# Patient Record
Sex: Male | Born: 1960 | Race: White | Hispanic: No | Marital: Married | Smoking: Former smoker
Health system: Southern US, Community
[De-identification: ages and names within clinical notes are randomized; demographics above are authoritative.]

## PROBLEM LIST (undated history)

## (undated) DIAGNOSIS — M549 Dorsalgia, unspecified: Secondary | ICD-10-CM

## (undated) HISTORY — PX: TONSILLECTOMY: SUR1361

## (undated) HISTORY — PX: BACK SURGERY: SHX140

## (undated) HISTORY — PX: FINGER SURGERY: SHX640

---

## 2007-09-14 ENCOUNTER — Ambulatory Visit: Payer: Self-pay | Admitting: Pain Medicine

## 2007-09-15 ENCOUNTER — Ambulatory Visit: Payer: Self-pay | Admitting: Pain Medicine

## 2007-10-01 ENCOUNTER — Ambulatory Visit: Payer: Self-pay | Admitting: Pain Medicine

## 2007-10-15 ENCOUNTER — Ambulatory Visit: Payer: Self-pay | Admitting: Pain Medicine

## 2007-11-02 ENCOUNTER — Ambulatory Visit: Payer: Self-pay | Admitting: Pain Medicine

## 2007-11-06 ENCOUNTER — Ambulatory Visit: Payer: Self-pay | Admitting: Pain Medicine

## 2007-11-18 ENCOUNTER — Ambulatory Visit: Payer: Self-pay | Admitting: Pain Medicine

## 2008-04-18 ENCOUNTER — Ambulatory Visit: Payer: Self-pay | Admitting: Unknown Physician Specialty

## 2008-04-21 ENCOUNTER — Inpatient Hospital Stay: Payer: Self-pay | Admitting: Unknown Physician Specialty

## 2008-05-12 ENCOUNTER — Ambulatory Visit: Payer: Self-pay | Admitting: Orthopedic Surgery

## 2008-09-02 ENCOUNTER — Ambulatory Visit: Payer: Self-pay | Admitting: Anesthesiology

## 2008-09-20 ENCOUNTER — Emergency Department: Payer: Self-pay | Admitting: Emergency Medicine

## 2008-10-03 ENCOUNTER — Ambulatory Visit: Payer: Self-pay | Admitting: Unknown Physician Specialty

## 2009-02-22 ENCOUNTER — Ambulatory Visit: Payer: Self-pay | Admitting: Unknown Physician Specialty

## 2009-06-01 ENCOUNTER — Ambulatory Visit: Payer: Self-pay

## 2009-07-13 ENCOUNTER — Ambulatory Visit: Payer: Self-pay | Admitting: Unknown Physician Specialty

## 2009-07-20 ENCOUNTER — Inpatient Hospital Stay: Payer: Self-pay | Admitting: Unknown Physician Specialty

## 2011-03-01 ENCOUNTER — Ambulatory Visit: Payer: Self-pay | Admitting: Unknown Physician Specialty

## 2011-03-21 ENCOUNTER — Ambulatory Visit: Payer: Self-pay | Admitting: Unknown Physician Specialty

## 2011-03-28 ENCOUNTER — Inpatient Hospital Stay: Payer: Self-pay | Admitting: Unknown Physician Specialty

## 2011-09-18 LAB — HEPATIC FUNCTION PANEL A (ARMC)
Albumin: 3.8 g/dL (ref 3.4–5.0)
Alkaline Phosphatase: 111 U/L (ref 50–136)
Bilirubin,Total: 0.6 mg/dL (ref 0.2–1.0)
SGOT(AST): 107 U/L — ABNORMAL HIGH (ref 15–37)
SGPT (ALT): 84 U/L — ABNORMAL HIGH
Total Protein: 7.8 g/dL (ref 6.4–8.2)

## 2011-09-18 LAB — CBC WITH DIFFERENTIAL/PLATELET
Basophil %: 1 %
Eosinophil #: 0.2 10*3/uL (ref 0.0–0.7)
HGB: 13.4 g/dL (ref 13.0–18.0)
Lymphocyte %: 33.7 %
MCHC: 32.4 g/dL (ref 32.0–36.0)
MCV: 102 fL — ABNORMAL HIGH (ref 80–100)
Monocyte #: 0.4 x10 3/mm (ref 0.2–1.0)
Monocyte %: 7.9 %
Neutrophil #: 2.5 10*3/uL (ref 1.4–6.5)
RDW: 14 % (ref 11.5–14.5)

## 2011-09-18 LAB — IRON AND TIBC
Iron Bind.Cap.(Total): 364 ug/dL (ref 250–450)
Iron Saturation: 43 %

## 2014-11-24 ENCOUNTER — Other Ambulatory Visit: Payer: Self-pay | Admitting: Anesthesiology

## 2014-11-24 DIAGNOSIS — M5417 Radiculopathy, lumbosacral region: Secondary | ICD-10-CM

## 2014-12-01 ENCOUNTER — Ambulatory Visit
Admission: RE | Admit: 2014-12-01 | Discharge: 2014-12-01 | Disposition: A | Discharge status: Home / Self Care | Payer: Managed Care, Other (non HMO) | Source: Ambulatory Visit | Attending: Anesthesiology | Admitting: Anesthesiology

## 2014-12-01 DIAGNOSIS — M5417 Radiculopathy, lumbosacral region: Secondary | ICD-10-CM

## 2014-12-01 MED ORDER — HYDROMORPHONE HCL 2 MG/ML IJ SOLN
1.5000 mg | Freq: Once | INTRAMUSCULAR | Status: AC
  Administered 2014-12-01: 1.5 mg via INTRAMUSCULAR

## 2014-12-01 MED ORDER — ONDANSETRON HCL 4 MG/2ML IJ SOLN: 4.0000 mg | Freq: Four times a day (QID) | INTRAMUSCULAR | Status: DC | PRN

## 2014-12-01 MED ORDER — DIAZEPAM 5 MG PO TABS
10.0000 mg | ORAL_TABLET | Freq: Once | ORAL | Status: AC
  Administered 2014-12-01: 10 mg via ORAL

## 2014-12-01 MED ORDER — IOHEXOL 180 MG/ML  SOLN
15.0000 mL | Freq: Once | INTRAMUSCULAR | Status: AC | PRN
  Administered 2014-12-01: 15 mL via INTRATHECAL

## 2014-12-01 MED ORDER — ONDANSETRON HCL 4 MG/2ML IJ SOLN
4.0000 mg | Freq: Once | INTRAMUSCULAR | Status: AC
  Administered 2014-12-01: 4 mg via INTRAMUSCULAR

## 2014-12-01 NOTE — Discharge Instructions (Signed)

## 2015-01-13 ENCOUNTER — Other Ambulatory Visit: Payer: Self-pay | Admitting: Neurological Surgery

## 2015-02-01 ENCOUNTER — Encounter (HOSPITAL_COMMUNITY)
Admission: RE | Admit: 2015-02-01 | Discharge: 2015-02-01 | Disposition: A | Discharge status: Home / Self Care | Payer: Managed Care, Other (non HMO) | Source: Ambulatory Visit | Attending: Neurological Surgery | Admitting: Neurological Surgery

## 2015-02-01 ENCOUNTER — Encounter (HOSPITAL_COMMUNITY): Payer: Self-pay

## 2015-02-01 DIAGNOSIS — Z0183 Encounter for blood typing: Secondary | ICD-10-CM | POA: Diagnosis not present

## 2015-02-01 DIAGNOSIS — M4316 Spondylolisthesis, lumbar region: Secondary | ICD-10-CM | POA: Insufficient documentation

## 2015-02-01 DIAGNOSIS — Z01812 Encounter for preprocedural laboratory examination: Secondary | ICD-10-CM | POA: Diagnosis present

## 2015-02-01 HISTORY — DX: Dorsalgia, unspecified: M54.9

## 2015-02-01 LAB — ABO/RH: ABO/RH(D): O POS

## 2015-02-01 LAB — TYPE AND SCREEN
ABO/RH(D): O POS
ANTIBODY SCREEN: NEGATIVE

## 2015-02-01 LAB — CBC
HCT: 43 % (ref 39.0–52.0)
Hemoglobin: 14.5 g/dL (ref 13.0–17.0)
MCH: 34.4 pg — ABNORMAL HIGH (ref 26.0–34.0)
MCHC: 33.7 g/dL (ref 30.0–36.0)
MCV: 102.1 fL — ABNORMAL HIGH (ref 78.0–100.0)
PLATELETS: 176 10*3/uL (ref 150–400)
RBC: 4.21 MIL/uL — AB (ref 4.22–5.81)
RDW: 11.7 % (ref 11.5–15.5)
WBC: 6.1 10*3/uL (ref 4.0–10.5)

## 2015-02-01 LAB — SURGICAL PCR SCREEN
MRSA, PCR: NEGATIVE
Staphylococcus aureus: NEGATIVE

## 2015-02-01 NOTE — Pre-Procedure Instructions (Signed)
    James Erickson  02/01/2015     No Pharmacies Listed   Your procedure is scheduled on 02-07-2015  Tuesday   Report to [facility] Admitting at 5:30  A.M.   Call this number if you have problems the morning of surgery:  [phone]   Remember:  Do not eat food or drink liquids after midnight.   Take these medicines the morning of surgery with A SIP OF WATER pain medication if needed ,lyrcia   Do not wear jewelry,  Do not wear lotions, powders, or perfumes.  You may not wear deodorant.  Do not shave 48 hours prior to surgery.  Men may shave face and neck.   Do not bring valuables to the hospital.  [facility] is not responsible for any belongings or valuables.  Contacts, dentures or bridgework may not be worn into surgery.  Leave your suitcase in the car.  After surgery it may be brought to your room.  For patients admitted to the hospital, discharge time will be determined by your treatment team.  Patients discharged the day of surgery will not be allowed to drive home.    Special instructions:  See attached sheet for instructions on CHG shower  Please read over the following fact sheets that you were given. Pain Booklet, Blood Transfusion Information and Surgical Site Infection Prevention

## 2015-02-06 MED ORDER — CEFAZOLIN SODIUM-DEXTROSE 2-3 GM-% IV SOLR: 2.0000 g | INTRAVENOUS | Status: DC

## 2015-02-06 MED ORDER — DEXTROSE 5 % IV SOLN
2.0000 g | INTRAVENOUS | Status: AC
  Administered 2015-02-07 (×2): 2 g via INTRAVENOUS
  Filled 2015-02-06: qty 20

## 2015-02-06 NOTE — H&P (Signed)
CHIEF COMPLAINT:                                          Back pain, pain down the legs, left worse than right.    HISTORY OF PRESENT ILLNESS:                     [name] is a 54 year old, right-handed individual who tells me that he has had back pain and leg pain for a number of years.  He started having problems back around 2001 when he had a decompression and fusion at the L5-S1 level.  This was done by Dr. Ruthann Cancer.  For about an eight-year period, he seemed to do reasonably well, but then in 2008 developed further problems, and he had an additional fusion in the level above, also performed by Dr. Gerrit Heck.  After this, he started having progressively worsening left lower extremity pain and back pain and required a revision fusion in 2009 and then again in 2012, such that he has had a total of four back operations and now has evidence of hardware placed from L3 to L5 with a cage fusion at L5-S1.  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  The patient has been seen by Dr. Odette Fraction for pain management and recently, in July, underwent the myelogram with postmyelogram CAT scan.  This study demonstrates that he has listhesis at L2-3 with a severe stenosis and high-grade canal compromise at that level.  The alignment in the coronal and sagittal planes appears intact.  However, the patient has what appears to be a very flat back syndrome with lack of lordosis in the lower lumbar spine.    To further his workup today in the office, I obtained standing scoliosis film in the AP and lateral projections.  This demonstrates that his lordosis measures less than 20 degrees in the lateral image, and it appears that he has a significant disparity between his pelvic incidence and the straightness of his back.   PAST MEDICAL HISTORY:                                Otherwise reveals that his general health has been good.  He does not relate significant medical issues or any other surgeries.      Medications and Allergies:  He notes no allergies to any medications.  Current medications include oxycodone 7.5/325, along with OxyContin 30 twice a day for pain control.  He was recently started on Lyrica 150 mg to help with the left lower extremity pain.    REVIEW OF SYSTEMS:                                    Notable for the back pain and leg pain and neck pain on a 14-Point Review Sheet, and he demonstrates on the pain drawing that the worst sciatic-type pain is on that left side, involving the low back and down the left buttock and leg.  He also reports some pain and discomfort in the region of the right shoulder posteriorly.     PHYSICAL EXAMINATION:  On physical examination I noted that he stands with a marked antalgia involving the left lower extremity.  He circumducts the left lower extremity, but on testing individual muscles, I note that his iliopsoas, the quadriceps, the tibialis anterior, and the gastrocs have good strength individually.  He is able to step onto a single step with that left leg.  His tibialis anterior appears intact also.  His reflexes are symmetrically suppressed in the patellae and the Achilles both.    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  He has substantial left lumbar radicular symptoms, and ultimately I believe that he will require a surgical decompression and stabilization of the L2-3 level.  I do note the significant flattening of his back, and it would be good to restore some lordosis for the lumbar spine as best we can at the L2-3 level.  I indicated that surgery may involve removal of some or all of his previous hardware.  We cannot adequately clamp on to the rods that are used previously.  This will be determined at the time of surgery.  Nonetheless, the patient ultimately needs to undergo  surgical decompression in an effort to minimize his chance for any neurologic injury.  We will try to expedite this at the earliest possible convenience.

## 2015-02-07 ENCOUNTER — Inpatient Hospital Stay (HOSPITAL_COMMUNITY): Payer: Managed Care, Other (non HMO)

## 2015-02-07 ENCOUNTER — Inpatient Hospital Stay (HOSPITAL_COMMUNITY): Payer: Managed Care, Other (non HMO) | Admitting: Critical Care Medicine

## 2015-02-07 ENCOUNTER — Inpatient Hospital Stay (HOSPITAL_COMMUNITY)
Admission: RE | Admit: 2015-02-07 | Discharge: 2015-02-09 | DRG: 460 | Disposition: A | Discharge status: Home / Self Care | Payer: Managed Care, Other (non HMO) | Source: Ambulatory Visit | Attending: Neurological Surgery | Admitting: Neurological Surgery

## 2015-02-07 ENCOUNTER — Encounter (HOSPITAL_COMMUNITY)
Admission: RE | Disposition: A | Discharge status: Home / Self Care | Payer: Medicare Other | Source: Ambulatory Visit | Attending: Neurological Surgery

## 2015-02-07 ENCOUNTER — Encounter (HOSPITAL_COMMUNITY): Payer: Self-pay | Admitting: *Deleted

## 2015-02-07 DIAGNOSIS — Z981 Arthrodesis status: Secondary | ICD-10-CM

## 2015-02-07 DIAGNOSIS — M4806 Spinal stenosis, lumbar region: Secondary | ICD-10-CM | POA: Diagnosis present

## 2015-02-07 DIAGNOSIS — M545 Low back pain: Secondary | ICD-10-CM | POA: Diagnosis present

## 2015-02-07 DIAGNOSIS — M4037 Flatback syndrome, lumbosacral region: Secondary | ICD-10-CM | POA: Diagnosis present

## 2015-02-07 DIAGNOSIS — Y838 Other surgical procedures as the cause of abnormal reaction of the patient, or of later complication, without mention of misadventure at the time of the procedure: Secondary | ICD-10-CM | POA: Diagnosis present

## 2015-02-07 DIAGNOSIS — T84226A Displacement of internal fixation device of vertebrae, initial encounter: Secondary | ICD-10-CM | POA: Diagnosis present

## 2015-02-07 DIAGNOSIS — M48062 Spinal stenosis, lumbar region with neurogenic claudication: Secondary | ICD-10-CM | POA: Diagnosis present

## 2015-02-07 DIAGNOSIS — Z87891 Personal history of nicotine dependence: Secondary | ICD-10-CM | POA: Diagnosis not present

## 2015-02-07 DIAGNOSIS — Z419 Encounter for procedure for purposes other than remedying health state, unspecified: Secondary | ICD-10-CM

## 2015-02-07 SURGERY — POSTERIOR LUMBAR FUSION 1 LEVEL
Anesthesia: General | Site: Back

## 2015-02-07 MED ORDER — LIDOCAINE-EPINEPHRINE 1 %-1:100000 IJ SOLN
INTRAMUSCULAR | Status: DC | PRN
  Administered 2015-02-07: 10 mL

## 2015-02-07 MED ORDER — ROCURONIUM BROMIDE 100 MG/10ML IV SOLN
INTRAVENOUS | Status: DC | PRN
  Administered 2015-02-07: 50 mg via INTRAVENOUS

## 2015-02-07 MED ORDER — FENTANYL CITRATE (PF) 250 MCG/5ML IJ SOLN
INTRAMUSCULAR | Status: AC
  Filled 2015-02-07: qty 5

## 2015-02-07 MED ORDER — HYDROCODONE-ACETAMINOPHEN 5-325 MG PO TABS
ORAL_TABLET | ORAL | Status: AC
  Filled 2015-02-07: qty 2

## 2015-02-07 MED ORDER — SODIUM CHLORIDE 0.9 % IV SOLN
INTRAVENOUS | Status: DC
  Administered 2015-02-07: 16:00:00 via INTRAVENOUS

## 2015-02-07 MED ORDER — LACTATED RINGERS IV SOLN
INTRAVENOUS | Status: DC | PRN
  Administered 2015-02-07 (×2): via INTRAVENOUS

## 2015-02-07 MED ORDER — OXYCODONE-ACETAMINOPHEN 7.5-325 MG PO TABS: 0.5000 | ORAL_TABLET | Freq: Three times a day (TID) | ORAL | Status: DC | PRN

## 2015-02-07 MED ORDER — ONDANSETRON HCL 4 MG/2ML IJ SOLN
INTRAMUSCULAR | Status: DC | PRN
  Administered 2015-02-07: 4 mg via INTRAVENOUS

## 2015-02-07 MED ORDER — SODIUM CHLORIDE 0.9 % IV SOLN
250.0000 mL | INTRAVENOUS | Status: DC
  Administered 2015-02-07: 250 mL via INTRAVENOUS

## 2015-02-07 MED ORDER — SODIUM CHLORIDE 0.9 % IJ SOLN
INTRAMUSCULAR | Status: AC
  Filled 2015-02-07: qty 10

## 2015-02-07 MED ORDER — ONDANSETRON HCL 4 MG/2ML IJ SOLN: 4.0000 mg | INTRAMUSCULAR | Status: DC | PRN

## 2015-02-07 MED ORDER — LIDOCAINE HCL (CARDIAC) 20 MG/ML IV SOLN
INTRAVENOUS | Status: DC | PRN
  Administered 2015-02-07: 100 mg via INTRAVENOUS

## 2015-02-07 MED ORDER — HYDROMORPHONE HCL 1 MG/ML IJ SOLN
0.5000 mg | INTRAMUSCULAR | Status: DC | PRN
  Administered 2015-02-07 (×2): 1 mg via INTRAVENOUS
  Filled 2015-02-07 (×2): qty 1

## 2015-02-07 MED ORDER — MAGNESIUM CITRATE PO SOLN: 1.0000 | Freq: Once | ORAL | Status: DC | PRN

## 2015-02-07 MED ORDER — DOCUSATE SODIUM 100 MG PO CAPS
100.0000 mg | ORAL_CAPSULE | Freq: Two times a day (BID) | ORAL | Status: DC
  Administered 2015-02-07 – 2015-02-09 (×4): 100 mg via ORAL
  Filled 2015-02-07 (×4): qty 1

## 2015-02-07 MED ORDER — MENTHOL 3 MG MT LOZG: 1.0000 | LOZENGE | OROMUCOSAL | Status: DC | PRN

## 2015-02-07 MED ORDER — MIDAZOLAM HCL 5 MG/5ML IJ SOLN
INTRAMUSCULAR | Status: DC | PRN
  Administered 2015-02-07: 2 mg via INTRAVENOUS

## 2015-02-07 MED ORDER — ACETAMINOPHEN 325 MG PO TABS: 650.0000 mg | ORAL_TABLET | ORAL | Status: DC | PRN

## 2015-02-07 MED ORDER — OXYCODONE HCL ER 30 MG PO T12A: 30.0000 mg | EXTENDED_RELEASE_TABLET | Freq: Two times a day (BID) | ORAL | Status: DC

## 2015-02-07 MED ORDER — CEFAZOLIN SODIUM-DEXTROSE 2-3 GM-% IV SOLR
INTRAVENOUS | Status: AC
  Filled 2015-02-07: qty 50

## 2015-02-07 MED ORDER — ALUM & MAG HYDROXIDE-SIMETH 200-200-20 MG/5ML PO SUSP
30.0000 mL | Freq: Four times a day (QID) | ORAL | Status: DC | PRN
Start: 2015-02-07 — End: 2015-02-09

## 2015-02-07 MED ORDER — ONDANSETRON HCL 4 MG/2ML IJ SOLN: 4.0000 mg | Freq: Once | INTRAMUSCULAR | Status: DC | PRN

## 2015-02-07 MED ORDER — LIDOCAINE HCL (CARDIAC) 20 MG/ML IV SOLN
INTRAVENOUS | Status: AC
  Filled 2015-02-07: qty 5

## 2015-02-07 MED ORDER — THROMBIN 5000 UNITS EX SOLR
CUTANEOUS | Status: DC | PRN
  Administered 2015-02-07: 09:00:00 via TOPICAL

## 2015-02-07 MED ORDER — PROPOFOL 10 MG/ML IV BOLUS
INTRAVENOUS | Status: AC
  Filled 2015-02-07: qty 20

## 2015-02-07 MED ORDER — MEPERIDINE HCL 25 MG/ML IJ SOLN: 6.2500 mg | INTRAMUSCULAR | Status: DC | PRN

## 2015-02-07 MED ORDER — ARTIFICIAL TEARS OP OINT
TOPICAL_OINTMENT | OPHTHALMIC | Status: DC | PRN
Start: 2015-02-07 — End: 2015-02-07
  Administered 2015-02-07: 1 via OPHTHALMIC

## 2015-02-07 MED ORDER — DEXTROSE 5 % IV SOLN
500.0000 mg | Freq: Four times a day (QID) | INTRAVENOUS | Status: DC | PRN
  Administered 2015-02-07: 500 mg via INTRAVENOUS
  Filled 2015-02-07 (×2): qty 5

## 2015-02-07 MED ORDER — HYDROCODONE-ACETAMINOPHEN 5-325 MG PO TABS: 1.0000 | ORAL_TABLET | ORAL | Status: DC | PRN

## 2015-02-07 MED ORDER — ARTIFICIAL TEARS OP OINT
TOPICAL_OINTMENT | OPHTHALMIC | Status: AC
  Filled 2015-02-07: qty 3.5

## 2015-02-07 MED ORDER — ROCURONIUM BROMIDE 50 MG/5ML IV SOLN
INTRAVENOUS | Status: AC
  Filled 2015-02-07: qty 1

## 2015-02-07 MED ORDER — KETOROLAC TROMETHAMINE 15 MG/ML IJ SOLN
INTRAMUSCULAR | Status: AC
  Filled 2015-02-07: qty 1

## 2015-02-07 MED ORDER — BUPIVACAINE HCL (PF) 0.5 % IJ SOLN
INTRAMUSCULAR | Status: DC | PRN
Start: 2015-02-07 — End: 2015-02-07
  Administered 2015-02-07: 18 mL

## 2015-02-07 MED ORDER — ONDANSETRON HCL 4 MG/2ML IJ SOLN
INTRAMUSCULAR | Status: AC
  Filled 2015-02-07: qty 2

## 2015-02-07 MED ORDER — DEXAMETHASONE SODIUM PHOSPHATE 10 MG/ML IJ SOLN
INTRAMUSCULAR | Status: AC
Start: 2015-02-07 — End: 2015-02-07
  Filled 2015-02-07: qty 1

## 2015-02-07 MED ORDER — METHOCARBAMOL 500 MG PO TABS
500.0000 mg | ORAL_TABLET | Freq: Four times a day (QID) | ORAL | Status: DC | PRN
Start: 2015-02-07 — End: 2015-02-09
  Administered 2015-02-08: 500 mg via ORAL
  Filled 2015-02-07 (×2): qty 1

## 2015-02-07 MED ORDER — MIDAZOLAM HCL 2 MG/2ML IJ SOLN
INTRAMUSCULAR | Status: AC
  Filled 2015-02-07: qty 4

## 2015-02-07 MED ORDER — EPHEDRINE SULFATE 50 MG/ML IJ SOLN
INTRAMUSCULAR | Status: AC
  Filled 2015-02-07: qty 1

## 2015-02-07 MED ORDER — DEXTROSE 5 % IV SOLN
10.0000 mg | INTRAVENOUS | Status: DC | PRN
  Administered 2015-02-07: 20 ug/min via INTRAVENOUS

## 2015-02-07 MED ORDER — KETOROLAC TROMETHAMINE 15 MG/ML IJ SOLN
15.0000 mg | Freq: Four times a day (QID) | INTRAMUSCULAR | Status: AC
  Administered 2015-02-07 – 2015-02-08 (×5): 15 mg via INTRAVENOUS
  Filled 2015-02-07 (×4): qty 1

## 2015-02-07 MED ORDER — BUPIVACAINE HCL (PF) 0.5 % IJ SOLN
INTRAMUSCULAR | Status: DC | PRN
  Administered 2015-02-07: 10 mL

## 2015-02-07 MED ORDER — SODIUM CHLORIDE 0.9 % IJ SOLN
3.0000 mL | Freq: Two times a day (BID) | INTRAMUSCULAR | Status: DC
  Administered 2015-02-07 – 2015-02-08 (×3): 3 mL via INTRAVENOUS

## 2015-02-07 MED ORDER — PREGABALIN 75 MG PO CAPS
150.0000 mg | ORAL_CAPSULE | Freq: Two times a day (BID) | ORAL | Status: DC
  Administered 2015-02-07 – 2015-02-09 (×4): 150 mg via ORAL
  Filled 2015-02-07 (×4): qty 2

## 2015-02-07 MED ORDER — SODIUM CHLORIDE 0.9 % IJ SOLN: 3.0000 mL | INTRAMUSCULAR | Status: DC | PRN

## 2015-02-07 MED ORDER — HYDROMORPHONE HCL 1 MG/ML IJ SOLN
INTRAMUSCULAR | Status: AC
  Filled 2015-02-07: qty 1

## 2015-02-07 MED ORDER — PHENYLEPHRINE 40 MCG/ML (10ML) SYRINGE FOR IV PUSH (FOR BLOOD PRESSURE SUPPORT)
PREFILLED_SYRINGE | INTRAVENOUS | Status: AC
  Filled 2015-02-07: qty 10

## 2015-02-07 MED ORDER — POLYETHYLENE GLYCOL 3350 17 G PO PACK: 17.0000 g | PACK | Freq: Every day | ORAL | Status: DC | PRN

## 2015-02-07 MED ORDER — PROPOFOL 10 MG/ML IV BOLUS
INTRAVENOUS | Status: DC | PRN
  Administered 2015-02-07: 150 mg via INTRAVENOUS

## 2015-02-07 MED ORDER — HYDROMORPHONE HCL 1 MG/ML IJ SOLN
0.5000 mg | INTRAMUSCULAR | Status: AC | PRN
  Administered 2015-02-07 (×2): 0.5 mg via INTRAVENOUS

## 2015-02-07 MED ORDER — 0.9 % SODIUM CHLORIDE (POUR BTL) OPTIME
TOPICAL | Status: DC | PRN
  Administered 2015-02-07: 1000 mL

## 2015-02-07 MED ORDER — PHENOL 1.4 % MT LIQD: 1.0000 | OROMUCOSAL | Status: DC | PRN

## 2015-02-07 MED ORDER — OXYCODONE-ACETAMINOPHEN 5-325 MG PO TABS
1.0000 | ORAL_TABLET | ORAL | Status: DC | PRN
  Administered 2015-02-07 – 2015-02-09 (×6): 2 via ORAL
  Filled 2015-02-07 (×6): qty 2

## 2015-02-07 MED ORDER — OXYCODONE HCL ER 15 MG PO T12A
30.0000 mg | EXTENDED_RELEASE_TABLET | Freq: Two times a day (BID) | ORAL | Status: DC
  Administered 2015-02-07 – 2015-02-09 (×5): 30 mg via ORAL
  Filled 2015-02-07 (×5): qty 2

## 2015-02-07 MED ORDER — HYDROMORPHONE HCL 1 MG/ML IJ SOLN
0.2500 mg | INTRAMUSCULAR | Status: DC | PRN
  Administered 2015-02-07 (×4): 0.5 mg via INTRAVENOUS

## 2015-02-07 MED ORDER — CEFAZOLIN SODIUM 1-5 GM-% IV SOLN
1.0000 g | Freq: Three times a day (TID) | INTRAVENOUS | Status: AC
  Administered 2015-02-07 – 2015-02-08 (×2): 1 g via INTRAVENOUS
  Filled 2015-02-07 (×2): qty 50

## 2015-02-07 MED ORDER — DEXAMETHASONE SODIUM PHOSPHATE 10 MG/ML IJ SOLN
INTRAMUSCULAR | Status: DC | PRN
  Administered 2015-02-07: 10 mg via INTRAVENOUS

## 2015-02-07 MED ORDER — BISACODYL 10 MG RE SUPP: 10.0000 mg | Freq: Every day | RECTAL | Status: DC | PRN

## 2015-02-07 MED ORDER — SENNA 8.6 MG PO TABS
1.0000 | ORAL_TABLET | Freq: Two times a day (BID) | ORAL | Status: DC
Start: 2015-02-07 — End: 2015-02-09
  Administered 2015-02-07 – 2015-02-09 (×4): 8.6 mg via ORAL
  Filled 2015-02-07 (×4): qty 1

## 2015-02-07 MED ORDER — SUCCINYLCHOLINE CHLORIDE 20 MG/ML IJ SOLN
INTRAMUSCULAR | Status: AC
  Filled 2015-02-07: qty 1

## 2015-02-07 MED ORDER — SODIUM CHLORIDE 0.9 % IR SOLN
Status: DC | PRN
  Administered 2015-02-07: 09:00:00

## 2015-02-07 MED ORDER — THROMBIN 20000 UNITS EX SOLR
CUTANEOUS | Status: DC | PRN
  Administered 2015-02-07: 09:00:00 via TOPICAL

## 2015-02-07 MED ORDER — FENTANYL CITRATE (PF) 100 MCG/2ML IJ SOLN
INTRAMUSCULAR | Status: DC | PRN
  Administered 2015-02-07 (×6): 50 ug via INTRAVENOUS
  Administered 2015-02-07: 100 ug via INTRAVENOUS

## 2015-02-07 MED ORDER — ACETAMINOPHEN 650 MG RE SUPP: 650.0000 mg | RECTAL | Status: DC | PRN

## 2015-02-07 SURGICAL SUPPLY — 65 items
BAG DECANTER FOR FLEXI CONT (MISCELLANEOUS) ×3 IMPLANT
BLADE CLIPPER SURG (BLADE) IMPLANT
BONE MATRIX OSTEOCEL PRO MED (Bone Implant) ×3 IMPLANT
BUR MATCHSTICK NEURO 3.0 LAGG (BURR) ×3 IMPLANT
CAGE COROENT LRG 8X9X28M SPINE (Cage) ×6 IMPLANT
CANISTER SUCT 3000ML PPV (MISCELLANEOUS) ×3 IMPLANT
CONT SPEC 4OZ CLIKSEAL STRL BL (MISCELLANEOUS) ×3 IMPLANT
COVER BACK TABLE 60X90IN (DRAPES) ×3 IMPLANT
DECANTER SPIKE VIAL GLASS SM (MISCELLANEOUS) ×3 IMPLANT
DERMABOND ADVANCED (GAUZE/BANDAGES/DRESSINGS) ×2
DERMABOND ADVANCED .7 DNX12 (GAUZE/BANDAGES/DRESSINGS) ×1 IMPLANT
DRAPE C-ARM 42X72 X-RAY (DRAPES) ×6 IMPLANT
DRAPE LAPAROTOMY 100X72X124 (DRAPES) ×3 IMPLANT
DRAPE POUCH INSTRU U-SHP 10X18 (DRAPES) ×3 IMPLANT
DRAPE PROXIMA HALF (DRAPES) IMPLANT
DRSG OPSITE POSTOP 4X8 (GAUZE/BANDAGES/DRESSINGS) ×3 IMPLANT
DURAPREP 26ML APPLICATOR (WOUND CARE) ×3 IMPLANT
ELECT REM PT RETURN 9FT ADLT (ELECTROSURGICAL) ×3
ELECTRODE REM PT RTRN 9FT ADLT (ELECTROSURGICAL) ×1 IMPLANT
GAUZE SPONGE 4X4 12PLY STRL (GAUZE/BANDAGES/DRESSINGS) IMPLANT
GAUZE SPONGE 4X4 16PLY XRAY LF (GAUZE/BANDAGES/DRESSINGS) IMPLANT
GLOVE BIOGEL PI IND STRL 7.0 (GLOVE) ×4 IMPLANT
GLOVE BIOGEL PI IND STRL 7.5 (GLOVE) ×1 IMPLANT
GLOVE BIOGEL PI IND STRL 8.5 (GLOVE) ×2 IMPLANT
GLOVE BIOGEL PI INDICATOR 7.0 (GLOVE) ×8
GLOVE BIOGEL PI INDICATOR 7.5 (GLOVE) ×2
GLOVE BIOGEL PI INDICATOR 8.5 (GLOVE) ×4
GLOVE ECLIPSE 7.0 STRL STRAW (GLOVE) ×3 IMPLANT
GLOVE ECLIPSE 8.5 STRL (GLOVE) ×6 IMPLANT
GLOVE EXAM NITRILE LRG STRL (GLOVE) IMPLANT
GLOVE EXAM NITRILE MD LF STRL (GLOVE) IMPLANT
GLOVE EXAM NITRILE XL STR (GLOVE) IMPLANT
GLOVE EXAM NITRILE XS STR PU (GLOVE) IMPLANT
GOWN STRL REUS W/ TWL LRG LVL3 (GOWN DISPOSABLE) ×2 IMPLANT
GOWN STRL REUS W/ TWL XL LVL3 (GOWN DISPOSABLE) IMPLANT
GOWN STRL REUS W/TWL 2XL LVL3 (GOWN DISPOSABLE) ×6 IMPLANT
GOWN STRL REUS W/TWL LRG LVL3 (GOWN DISPOSABLE) ×4
GOWN STRL REUS W/TWL XL LVL3 (GOWN DISPOSABLE)
HEMOSTAT POWDER KIT SURGIFOAM (HEMOSTASIS) ×3 IMPLANT
KIT BASIN OR (CUSTOM PROCEDURE TRAY) ×3 IMPLANT
KIT ROOM TURNOVER OR (KITS) ×3 IMPLANT
MILL MEDIUM DISP (BLADE) ×3 IMPLANT
NEEDLE HYPO 22GX1.5 SAFETY (NEEDLE) ×3 IMPLANT
NS IRRIG 1000ML POUR BTL (IV SOLUTION) ×3 IMPLANT
PACK FOAM VITOSS 5CC (Orthopedic Implant) ×3 IMPLANT
PACK LAMINECTOMY NEURO (CUSTOM PROCEDURE TRAY) ×3 IMPLANT
PAD ARMBOARD 7.5X6 YLW CONV (MISCELLANEOUS) ×9 IMPLANT
PATTIES SURGICAL .5 X1 (DISPOSABLE) IMPLANT
RASP 3.0MM (RASP) ×3 IMPLANT
ROD RELINE-O LORD 5.5X50MM (Rod) ×6 IMPLANT
SCREW LOCK RELINE 5.5 TULIP (Screw) ×12 IMPLANT
SCREW RELINE-O POLY 6.5X45 (Screw) ×12 IMPLANT
SPONGE LAP 4X18 X RAY DECT (DISPOSABLE) IMPLANT
SPONGE SURGIFOAM ABS GEL 100 (HEMOSTASIS) ×3 IMPLANT
SUT VIC AB 1 CT1 18XBRD ANBCTR (SUTURE) ×1 IMPLANT
SUT VIC AB 1 CT1 8-18 (SUTURE) ×2
SUT VIC AB 2-0 CP2 18 (SUTURE) ×3 IMPLANT
SUT VIC AB 3-0 SH 8-18 (SUTURE) ×9 IMPLANT
SYR 3ML LL SCALE MARK (SYRINGE) ×12 IMPLANT
TOWEL OR 17X24 6PK STRL BLUE (TOWEL DISPOSABLE) ×3 IMPLANT
TOWEL OR 17X26 10 PK STRL BLUE (TOWEL DISPOSABLE) ×3 IMPLANT
TRAP SPECIMEN MUCOUS 40CC (MISCELLANEOUS) ×3 IMPLANT
TRAY FOLEY CATH 16FRSI W/METER (SET/KITS/TRAYS/PACK) ×3 IMPLANT
TRAY FOLEY W/METER SILVER 14FR (SET/KITS/TRAYS/PACK) IMPLANT
WATER STERILE IRR 1000ML POUR (IV SOLUTION) ×3 IMPLANT

## 2015-02-07 NOTE — Anesthesia Preprocedure Evaluation (Addendum)
Anesthesia Evaluation  Patient identified by MRN, date of birth, ID band Patient awake    Reviewed: Allergy & Precautions, NPO status , Patient's Chart, lab work & pertinent test results  Airway Mallampati: I  TM Distance: >3 FB Neck ROM: Full    Dental  (+) Dental Advisory Given   Pulmonary former smoker,    Pulmonary exam normal        Cardiovascular Normal cardiovascular exam     Neuro/Psych    GI/Hepatic   Endo/Other    Renal/GU      Musculoskeletal   Abdominal   Peds  Hematology   Anesthesia Other Findings   Reproductive/Obstetrics                            Anesthesia Physical Anesthesia Plan  ASA: II  Anesthesia Plan: General   Post-op Pain Management:    Induction: Intravenous  Airway Management Planned: Oral ETT  Additional Equipment:   Intra-op Plan:   Post-operative Plan: Extubation in OR  Informed Consent: I have reviewed the patients History and Physical, chart, labs and discussed the procedure including the risks, benefits and alternatives for the proposed anesthesia with the patient or authorized representative who has indicated his/her understanding and acceptance.   Dental advisory given  Plan Discussed with: Surgeon and CRNA  Anesthesia Plan Comments:        Anesthesia Quick Evaluation

## 2015-02-07 NOTE — Anesthesia Procedure Notes (Signed)
Procedure Name: Intubation Date/Time: 02/07/2015 7:44 AM Performed by: Glo Herring B Pre-anesthesia Checklist: Patient identified, Timeout performed, Emergency Drugs available, Suction available and Patient being monitored Patient Re-evaluated:Patient Re-evaluated prior to inductionOxygen Delivery Method: Circle system utilized Preoxygenation: Pre-oxygenation with 100% oxygen Intubation Type: IV induction Ventilation: Mask ventilation without difficulty and Oral airway inserted - appropriate to patient size Laryngoscope Size: Mac and 4 Grade View: Grade I Tube type: Oral Tube size: 7.5 mm Number of attempts: 1 Airway Equipment and Method: Stylet Placement Confirmation: CO2 detector,  positive ETCO2,  ETT inserted through vocal cords under direct vision and breath sounds checked- equal and bilateral Secured at: 22 cm Tube secured with: Tape Dental Injury: Teeth and Oropharynx as per pre-operative assessment

## 2015-02-07 NOTE — Progress Notes (Addendum)
Pt is admitted to 5C04 from PACU. Admission vital is stable

## 2015-02-07 NOTE — Transfer of Care (Signed)
Immediate Anesthesia Transfer of Care Note  Patient: James Erickson  Procedure(s) Performed: Procedure(s) with comments: Lumbar two-three Posterior lumbar interbody fusion, exploration of fusion, hardware removal  (N/A) - L2-3 Posterior lumbar interbody fusion  Patient Location: PACU  Anesthesia Type:General  Level of Consciousness: awake, alert  and oriented  Airway & Oxygen Therapy: Patient Spontanous Breathing and Patient connected to nasal cannula oxygen  Post-op Assessment: Report given to RN, Post -op Vital signs reviewed and stable and Patient moving all extremities X 4  Post vital signs: Reviewed and stable  Last Vitals:  Filed Vitals:   02/07/15 0605  BP:   Pulse:   Temp: 36.7 C  Resp:     Complications: No apparent anesthesia complications

## 2015-02-07 NOTE — Op Note (Signed)
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 Anesthesia: Gen. endotracheal Indications: The patient is a 54 year old individual who has had significant issues of back pain and bilateral leg pain with weakness he's had previous decompression and fusion at L3 down to the sacrum by Dr. Gerrit Heck done several operations. He has had progressively worsening back pain and leg pain and has evidence of severe spinal stenosis at L1-L2 secondary to a spondylolisthesis and marked degeneration of the disc with a rotatory component at this level. He's been advised regarding the need for surgery and he compression and stabilization.  Procedure: The patient was brought to the operating room supine on a stretcher. After the smooth induction of general endotracheal anesthesia, he was carefully turned prone onto the operating table. The back was prepped with alcohol and DuraPrep and draped in a sterile fashion and elliptical incision was made around the thickened redundant scar in his mid lumbar spine so that this could be excised for a cleaner closure. The dissection was then carried down through the lumbar dorsal fascia and the old hardware was identified at L3. Dissection was carried inferiorly as the hardware was uncovered it was apparent that the screw on the left side was not connected to the rod. It was also apparent that the screw on the right side was loose in its holder and was only riding on the  rod but not firmly attached to it at this point it was decided to remove this hardware as it was ineffective it does appear that patient did form solid arthrodesis across the L3-L4 segment to effect removal of the hardware removed the cross-link connector that was present and further exploration of the arthrodesis yield the fact that it was solid down to the sacrum. The rod was then cut just above the L4 screw with a high-speed metal cutting bur bit. The area was then irrigated copiously and debrided of metallic fragments. Attention was then turned to the L2-3 segment were subperiosteal dissection was carried out to expose the facet joint capsule at L1 L2 and the transverse processes of L2. These were decorticated at L2 and L3 and packed away for later use in grafting. Then laminectomy was performed by removing the inferior margin lamina of L2 out to and including the entirety of the facet joint. The redundant thickened yellow ligament was taken up in this area and the common dural tube was exposed floor. The dissection was carried out laterally and here epidural veins were encountered that were cauterized and divided carefully the disc space was explored and it was noted to be bulging dorsally into the spinal canal. Then by gently retracting lateral edge of the dura the disc space was entered with a #15 blade and significant quantity of symptoms ligamentous disc material was removed from this region the disc space was then entered more fully in a series of disks shavers were used in the 70 mm configuration to loosen the disc from the endplates and allow for removal of the disc in its entirety from the disc space at L2-L3 this was performed first from the left side and then from the right side and  procedure carried on alternatively to evacuate entirety of the disc space. Once the endplates were completely removed the interspace was sized for an 8 10 mm tall 28 mm long peek spacer the interspace was filled with a  quantity of 12 mL of autograft and allograft which was osseous cell. The cages were packed and also placed into this interspace. The positioning of the cages was checked radiographically. Pedicle entry sites were then chosen at L2 and these were sounded tapped and drilled to fit 6.5 x 45 mm screws in L2. The screw holes from the L3 pedicle screws were then fitted with 6.5 x 45 mm pedicle screws. 50 mm precontoured rods were then used to connect the construct together final radiographs were obtained in AP and lateral projection in the positioning was noted be quite solid with this the wound was carefully explored the pads of the L2 and the L3 nerve roots were checked further continuity and decompression and when this was verified to be good the lumbar dorsal fascia was closed with #1 Vicryls in interrupted fashion and 20 Vicryls used in the subcutaneous tissues. 30 Vicryls used to close subcutaneous take her skin. Dermabond was placed on the skin. Patient tolerated procedure well blood loss is estimated to her 50 mL he was returned to recovery room in stable condition.

## 2015-02-07 NOTE — Anesthesia Postprocedure Evaluation (Signed)
Anesthesia Post Note  Patient: James Erickson  Procedure(s) Performed: Procedure(s) (LRB): Lumbar two-three Posterior lumbar interbody fusion, exploration of fusion, hardware removal  (N/A)  Anesthesia type: general  Patient location: PACU  Post pain: Pain level controlled  Post assessment: Patient's Cardiovascular Status Stable  Last Vitals:  Filed Vitals:   02/07/15 1420  BP: 139/86  Pulse: 81  Temp: 36.6 C  Resp: 15    Post vital signs: Reviewed and stable  Level of consciousness: sedated  Complications: No apparent anesthesia complications

## 2015-02-07 NOTE — Progress Notes (Signed)
Patient ID: James Erickson, male   DOB: 05/24/1960, 54 y.o.   MRN: [id number] Vital signs are stable Motor function is intact in lower extremities Dressing is clean and dry Complains of some left flank pain Doing well other wise

## 2015-02-08 MED ORDER — DEXAMETHASONE SODIUM PHOSPHATE 4 MG/ML IJ SOLN
4.0000 mg | Freq: Once | INTRAMUSCULAR | Status: AC
  Administered 2015-02-08: 4 mg via INTRAVENOUS
  Filled 2015-02-08: qty 1

## 2015-02-08 MED FILL — Heparin Sodium (Porcine) Inj 1000 Unit/ML: INTRAMUSCULAR | Qty: 30 | Status: AC

## 2015-02-08 MED FILL — Sodium Chloride IV Soln 0.9%: INTRAVENOUS | Qty: 1000 | Status: AC

## 2015-02-08 NOTE — Progress Notes (Signed)
Occupational Therapy Evaluation Patient Details Name: James Erickson MRN: [id number] DOB: 07-Nov-1960 Today's Date: 02/08/2015    History of Present Illness pt presents with L2-3 PLIF and hx of back surgeries.     Clinical Impression   All education regarding back precautions, compensatory techniques, use of DME and AE completed for ADL and functional mobility for ADL. Written information given. Pt/wife verbalized and demonstrated understanding. No further OT indicated at this time. Pt will be safe to D/C home from OT standpoint when medically stable.     Follow Up Recommendations  No OT follow up;Supervision - Intermittent    Equipment Recommendations  None recommended by OT    Recommendations for Other Services       Precautions / Restrictions Precautions Precautions: Back Precaution Booklet Issued: Yes (comment) Precaution Comments: reviewed with handout Required Braces or Orthoses: Spinal Brace Spinal Brace: Lumbar corset;Applied in sitting position Restrictions Weight Bearing Restrictions: No      Mobility Bed Mobility Overal bed mobility: Modified Independent Bed Mobility: Sit to Sidelying         Sit to sidelying: Supervision General bed mobility comments: able to return demonstrate from earlier PT session  Transfers Overall transfer level: Needs assistance Equipment used: Rolling walker (2 wheeled) Transfers: Sit to/from Stand Sit to Stand: Supervision         General transfer comment: cues for positioning    Balance Overall balance assessment: Needs assistance Sitting-balance support: No upper extremity supported;Feet supported Sitting balance-Leahy Scale: Good     Standing balance support: No upper extremity supported Standing balance-Leahy Scale: Fair Standing balance comment: pt able to stand without UE support, but stands very guarded.                              ADL Overall ADL's : Needs assistance/impaired                                     Functional mobility during ADLs: Supervision/safety General ADL Comments: Educated pt/wife on back precautions for ADLwith use of compensatory techniques and AE. Educated on hygiene after toileting with use of AE. Pt/wife verbalized understanding. Disucssded using DME for bathroom/shower initially to increase safety and adherance to back precautions.                      Pertinent Vitals/Pain Pain Assessment: 0-10 Pain Score: 5  Pain Location: back Pain Descriptors / Indicators: Aching Pain Intervention(s): Limited activity within patient's tolerance;Monitored during session     Hand Dominance     Extremity/Trunk Assessment Upper Extremity Assessment Upper Extremity Assessment: Overall WFL for tasks assessed   Lower Extremity Assessment Lower Extremity Assessment: Defer to PT evaluation   Cervical / Trunk Assessment Cervical / Trunk Assessment: Other exceptions Cervical / Trunk Exceptions: Flexed posture secondary to  long term back pain per pt report.     Communication Communication Communication: No difficulties   Cognition Arousal/Alertness: Awake/alert Behavior During Therapy: WFL for tasks assessed/performed Overall Cognitive Status: Within Functional Limits for tasks assessed                                        Home Living Family/patient expects to be discharged to:: Private residence Living Arrangements: Spouse/significant other Available Help at  Discharge: Family;Available 24 hours/day Type of Home: House Home Access: Level entry     Home Layout: Multi-level Alternate Level Stairs-Number of Steps: Flight Alternate Level Stairs-Rails: Left Bathroom Shower/Tub: Chief Strategy Officer: Handicapped height     Home Equipment: Cane - single point;Shower seat;Other (comment) (have RW they can borrow)          Prior Functioning/Environment Level of Independence: Independent with assistive  device(s) (Uses canes)             OT Diagnosis: Acute pain   OT Problem List: Decreased strength;Decreased knowledge of use of DME or AE;Decreased knowledge of precautions;Pain   OT Treatment/Interventions:      OT Goals(Current goals can be found in the care plan section) Acute Rehab OT Goals Patient Stated Goal: Walk the greenways OT Goal Formulation: All assessment and education complete, DC therapy  OT Frequency:     Barriers to D/C:            Co-evaluation              End of Session Nurse Communication: Mobility status;Precautions  Activity Tolerance: Patient tolerated treatment well Patient left: in bed;with call bell/phone within reach;with family/visitor present   Time: 0955-1009 OT Time Calculation (min): 14 min Charges:  OT General Charges $OT Visit: 1 Procedure OT Evaluation $Initial OT Evaluation Tier I: 1 Procedure G-Codes:    Ishi Danser,HILLARY 03/10/15, 10:17 AM   Luisa Dago, OTR/L  [phone] 03-10-15

## 2015-02-08 NOTE — Clinical Social Work Note (Signed)
CSW consult acknowledged:  Clinical Social Worker received consult in reference to SNF placement. PT/OT currently recommending "No PT/OT follow up".  Clinical Social Worker will sign off for now as social work intervention is no longer needed. Please consult [place] again if new need arises.  Derenda Fennel, MSW, LCSWA [phone] 02/08/2015 4:06 PM

## 2015-02-08 NOTE — Progress Notes (Signed)
Patient ID: James Erickson, male   DOB: November 07, 1960, 54 y.o.   MRN: [id number] Vital signs are stable Motor function is good in lower extremities Patient notes that he is having less pain than he is had a long time He is doing reasonably well Continues to require significant pain medication

## 2015-02-08 NOTE — Evaluation (Signed)
Physical Therapy Evaluation Patient Details Name: James Erickson MRN: [id number] DOB: December 21, 1960 Today's Date: 02/08/2015   History of Present Illness  pt presents with L2-3 PLIF and hx of back surgeries.    Clinical Impression  Pt very motivated to improve overall mobility and indicates pain is better now than prior to surgery.  Feel pt will continue to make great progress and will perform a flight of stairs tomorrow in anticipation of D/C.      Follow Up Recommendations No PT follow up;Supervision - Intermittent    Equipment Recommendations  Rolling walker with 5" wheels    Recommendations for Other Services       Precautions / Restrictions Precautions Precautions: Back Precaution Booklet Issued: No Precaution Comments: Reviewed precautions, but pt familiar from previous surgeries. Required Braces or Orthoses: Spinal Brace Spinal Brace: Lumbar corset;Applied in sitting position Restrictions Weight Bearing Restrictions: No      Mobility  Bed Mobility Overal bed mobility: Needs Assistance Bed Mobility: Sit to Sidelying         Sit to sidelying: Supervision General bed mobility comments: Cues for log roll technique, but no physical A needed.    Transfers Overall transfer level: Needs assistance Equipment used: Rolling walker (2 wheeled) Transfers: Sit to/from Stand Sit to Stand: Min guard         General transfer comment: cues for UE use and controlling descent to sitting.  Ambulation/Gait Ambulation/Gait assistance: Min guard Ambulation Distance (Feet): 250 Feet Assistive device: Rolling walker (2 wheeled) Gait Pattern/deviations: Step-through pattern;Trunk flexed     General Gait Details: pt moves with guarded posture and indicates this is better than he has been doing because the back pain was worse before surgery.    Stairs            Wheelchair Mobility    Modified Rankin (Stroke Patients Only)       Balance Overall balance assessment:  Needs assistance Sitting-balance support: No upper extremity supported;Feet supported Sitting balance-Leahy Scale: Good     Standing balance support: No upper extremity supported Standing balance-Leahy Scale: Fair Standing balance comment: pt able to stand without UE support, but stands very guarded.                               Pertinent Vitals/Pain Pain Assessment: 0-10 Pain Score: 4  Pain Location: Back Pain Descriptors / Indicators: Aching;Tightness Pain Intervention(s): Monitored during session;Premedicated before session;Repositioned    Home Living Family/patient expects to be discharged to:: Private residence Living Arrangements: Spouse/significant other Available Help at Discharge: Family;Available 24 hours/day Type of Home: House Home Access: Level entry     Home Layout: Multi-level Home Equipment: Cane - single point;Shower seat      Prior Function Level of Independence: Independent with assistive device(s) (Uses canes)               Hand Dominance        Extremity/Trunk Assessment   Upper Extremity Assessment: Defer to OT evaluation           Lower Extremity Assessment: Overall WFL for tasks assessed      Cervical / Trunk Assessment: Other exceptions  Communication   Communication: No difficulties  Cognition Arousal/Alertness: Awake/alert Behavior During Therapy: WFL for tasks assessed/performed Overall Cognitive Status: Within Functional Limits for tasks assessed                      General  Comments      Exercises        Assessment/Plan    PT Assessment Patient needs continued PT services  PT Diagnosis Difficulty walking   PT Problem List Decreased activity tolerance;Decreased balance;Decreased mobility;Decreased knowledge of use of DME;Decreased knowledge of precautions;Pain  PT Treatment Interventions DME instruction;Gait training;Stair training;Functional mobility training;Therapeutic  activities;Therapeutic exercise;Balance training;Neuromuscular re-education;Patient/family education   PT Goals (Current goals can be found in the Care Plan section) Acute Rehab PT Goals Patient Stated Goal: Walk the greenways PT Goal Formulation: With patient Time For Goal Achievement: 02/15/15 Potential to Achieve Goals: Good    Frequency Min 5X/week   Barriers to discharge        Co-evaluation               End of Session Equipment Utilized During Treatment: Gait belt;Back brace Activity Tolerance: Patient tolerated treatment well Patient left: in bed;with call bell/phone within reach;with family/visitor present Nurse Communication: Mobility status         Time: 8295-6213 PT Time Calculation (min) (ACUTE ONLY): 15 min   Charges:   PT Evaluation $Initial PT Evaluation Tier I: 1 Procedure     PT G CodesSunny Schlein, [place] [phone] 02/08/2015, 9:16 AM

## 2015-02-08 NOTE — Care Management Note (Signed)
Case Management Note  Patient Details  Name: James Erickson MRN: [id number] Date of Birth: 1961-01-01  Subjective/Objective:                    Action/Plan: Patient admitted with Lumbar stenosis with neurogenic claudication. Patient underwent L2-3 PLIF. Patient is from home with spouse. CM will continue to follow for discharge needs.   Expected Discharge Date:                  Expected Discharge Plan:  Home/Self Care  In-House Referral:     Discharge planning Services     Post Acute Care Choice:    Choice offered to:     DME Arranged:    DME Agency:     HH Arranged:    HH Agency:     Status of Service:  In process, will continue to follow  Medicare Important Message Given:    Date Medicare IM Given:    Medicare IM give by:    Date Additional Medicare IM Given:    Additional Medicare Important Message give by:     If discussed at Long Length of Stay Meetings, dates discussed:    Additional Comments:  Kermit Balo, RN 02/08/2015, 1:57 PM

## 2015-02-09 MED ORDER — OXYCODONE HCL ER 30 MG PO T12A: 30.0000 mg | EXTENDED_RELEASE_TABLET | Freq: Two times a day (BID) | ORAL | Status: AC

## 2015-02-09 MED ORDER — DEXAMETHASONE 1 MG PO TABS: ORAL_TABLET | ORAL | Status: AC

## 2015-02-09 MED ORDER — OXYCODONE-ACETAMINOPHEN 7.5-325 MG PO TABS: 1.0000 | ORAL_TABLET | ORAL | Status: AC | PRN

## 2015-02-09 MED ORDER — METHOCARBAMOL 500 MG PO TABS: 500.0000 mg | ORAL_TABLET | Freq: Four times a day (QID) | ORAL | Status: AC | PRN

## 2015-02-09 NOTE — Progress Notes (Signed)
Physical Therapy Treatment Patient Details Name: James Erickson MRN: [id number] DOB: 1960/06/04 Today's Date: 02/09/2015    History of Present Illness pt presents with L2-3 PLIF and hx of back surgeries.      PT Comments    Pt moving well and able to ambulate with cane today and more erect posture.  Feel pt ready for D/C from PT perspective.    Follow Up Recommendations  No PT follow up;Supervision - Intermittent     Equipment Recommendations  None recommended by PT    Recommendations for Other Services       Precautions / Restrictions Precautions Precautions: Back Precaution Comments: pt able to verbalize 2/3 back precautions.   Required Braces or Orthoses: Spinal Brace Spinal Brace: Lumbar corset;Applied in sitting position Restrictions Weight Bearing Restrictions: No    Mobility  Bed Mobility               General bed mobility comments: pt sitting EOB  Transfers Overall transfer level: Modified independent Equipment used: Straight cane Transfers: Sit to/from Stand Sit to Stand: Modified independent (Device/Increase time)         General transfer comment: pt demonstrates good technique with transfers.    Ambulation/Gait Ambulation/Gait assistance: Supervision Ambulation Distance (Feet): 250 Feet Assistive device: Straight cane Gait Pattern/deviations: Step-through pattern;Trunk flexed     General Gait Details: pt demonstrates good use of cane and is working on more erect posture.     Stairs Stairs: Yes Stairs assistance: Supervision Stair Management: One rail Left;Forwards;Step to pattern;With cane Number of Stairs: 11 (x2) General stair comments: cues for safety and gait sequencing on stairs.    Wheelchair Mobility    Modified Rankin (Stroke Patients Only)       Balance Overall balance assessment: Needs assistance         Standing balance support: No upper extremity supported;During functional activity Standing balance-Leahy  Scale: Good                      Cognition Arousal/Alertness: Awake/alert Behavior During Therapy: WFL for tasks assessed/performed Overall Cognitive Status: Within Functional Limits for tasks assessed                      Exercises      General Comments        Pertinent Vitals/Pain Pain Assessment: 0-10 Pain Score: 3  Pain Location: Back Pain Descriptors / Indicators: Aching Pain Intervention(s): Monitored during session;Repositioned    Home Living                      Prior Function            PT Goals (current goals can now be found in the care plan section) Acute Rehab PT Goals Patient Stated Goal: Walk the greenways PT Goal Formulation: With patient Time For Goal Achievement: 02/15/15 Potential to Achieve Goals: Good Progress towards PT goals: Progressing toward goals    Frequency  Min 5X/week    PT Plan Current plan remains appropriate    Co-evaluation             End of Session Equipment Utilized During Treatment: Back brace Activity Tolerance: Patient tolerated treatment well Patient left: in bed;with call bell/phone within reach;with family/visitor present (Sitting EOB. )     Time: 5621-3086 PT Time Calculation (min) (ACUTE ONLY): 18 min  Charges:  $Gait Training: 8-22 mins  G CodesSunny Schlein,  [phone] 02/09/2015, 10:04 AM

## 2015-02-09 NOTE — Discharge Summary (Signed)
Physician Discharge Summary  Patient ID: GID SCHOFFSTALL MRN: [id number] DOB/AGE: Jun 23, 1960 54 y.o.  Admit date: 02/07/2015 Discharge date: 02/09/2015  Admission Diagnoses: L1-L2 stenosis status post decompression arthrodesis L2-L5  Discharge Diagnoses: L1-L2 stenosis status post decompression and arthrodesis L2-L5 Active Problems:   Lumbar stenosis with neurogenic claudication   Discharged Condition: good  Hospital Course: Patient was admitted to undergo surgical decompression at L1-L2 and arthrodesis at the L1-2 level also had previous arthrodesis at L to to L5 however was found that the hardware was loose and underwent exploration of the arthrodesis and removal of previous hardware from the L2 region.  Consults: None  Significant Diagnostic Studies: None  Treatments: surgery: Removal of hardware from L2 exploration of arthrodesis decompression L1-L2 with posterior lumbar interbody arthrodesis peek spacers local autograft and allograft pedicle screw fixation L1-L2  Discharge Exam: Blood pressure 136/82, pulse 61, temperature 98.2 F (36.8 C), temperature source Oral, resp. rate 20, height 5' 9.5" (1.765 m), weight 85.73 kg (189 lb), SpO2 99 %. Incision is clean and dry station and gait are normal  Disposition:  discharge home  Discharge Instructions    Call MD for:  redness, tenderness, or signs of infection (pain, swelling, redness, odor or green/yellow discharge around incision site)    Complete by:  As directed      Call MD for:  severe uncontrolled pain    Complete by:  As directed      Call MD for:  temperature >100.4    Complete by:  As directed      Diet - low sodium heart healthy    Complete by:  As directed      Discharge instructions    Complete by:  As directed   Okay to shower. Do not apply salves or appointments to incision. No heavy lifting with the upper extremities greater than 15 pounds. May resume driving when not requiring pain medication and patient feels  comfortable with doing so.     Increase activity slowly    Complete by:  As directed             Medication List    TAKE these medications        dexamethasone 1 MG tablet  Commonly known as:  DECADRON  2 tablets twice daily for 2 days, one tablet twice daily for 2 days, one tablet daily for 2 days.     methocarbamol 500 MG tablet  Commonly known as:  ROBAXIN  Take 1 tablet (500 mg total) by mouth every 6 (six) hours as needed for muscle spasms.     oxyCODONE-acetaminophen 7.5-325 MG tablet  Commonly known as:  PERCOCET  Take 0.5-1 tablets by mouth every 8 (eight) hours as needed for severe pain.     oxyCODONE-acetaminophen 7.5-325 MG tablet  Commonly known as:  PERCOCET  Take 1-2 tablets by mouth every 4 (four) hours as needed for severe pain.     OXYCONTIN 30 MG T12a  Generic drug:  OxyCODONE HCl ER  Take 30 mg by mouth 2 (two) times daily.     OxyCODONE HCl ER 30 MG T12a  Take 30 mg by mouth every 12 (twelve) hours.     pregabalin 150 MG capsule  Commonly known as:  LYRICA  Take 150 mg by mouth 2 (two) times daily.         SignedStefani Dama 02/09/2015, 9:51 AM

## 2015-02-09 NOTE — Progress Notes (Signed)
Pt discharged home with spouse. Left floor via wheelchair with volunteer services. IV removed and discharge instructions given. [facility]

## 2016-08-07 IMAGING — CR DG MYELOGRAPHY LUMBAR INJ LUMBOSACRAL
3 series · 3 of 3 positions shown · non-contrast
Comparison: Lumbar spine MRI 03/01/2011 and CT 02/22/2009

CLINICAL DATA: Lumbosacral radiculopathy. Predominantly left leg
radicular pain with some right-sided pain as well.
TECHNIQUE: Contiguous axial images were obtained through the Lumbar spine after
the intrathecal infusion of contrast. Coronal and sagittal
reconstructions were obtained of the axial image sets.

[w lumbar spine lat]
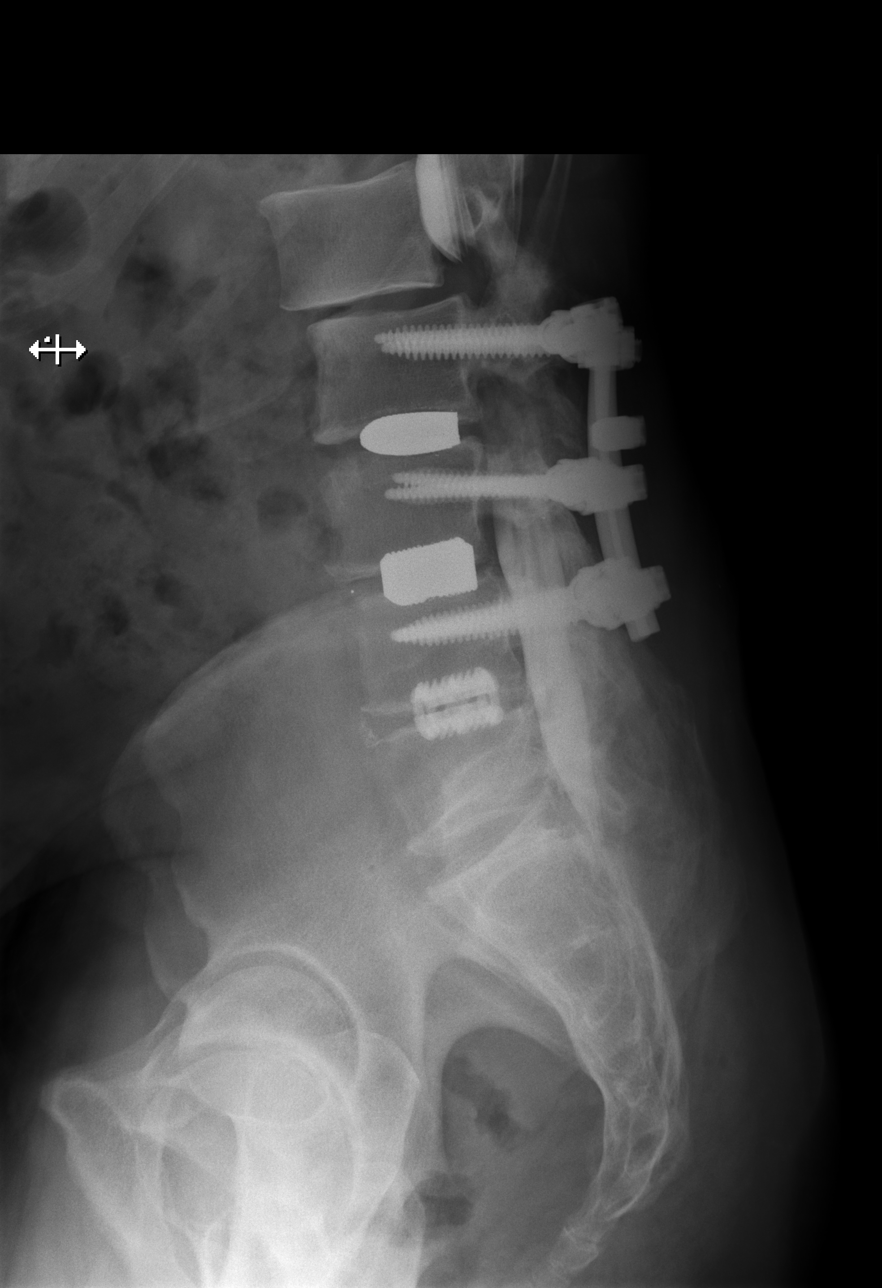

[w lumbar spine flexion]
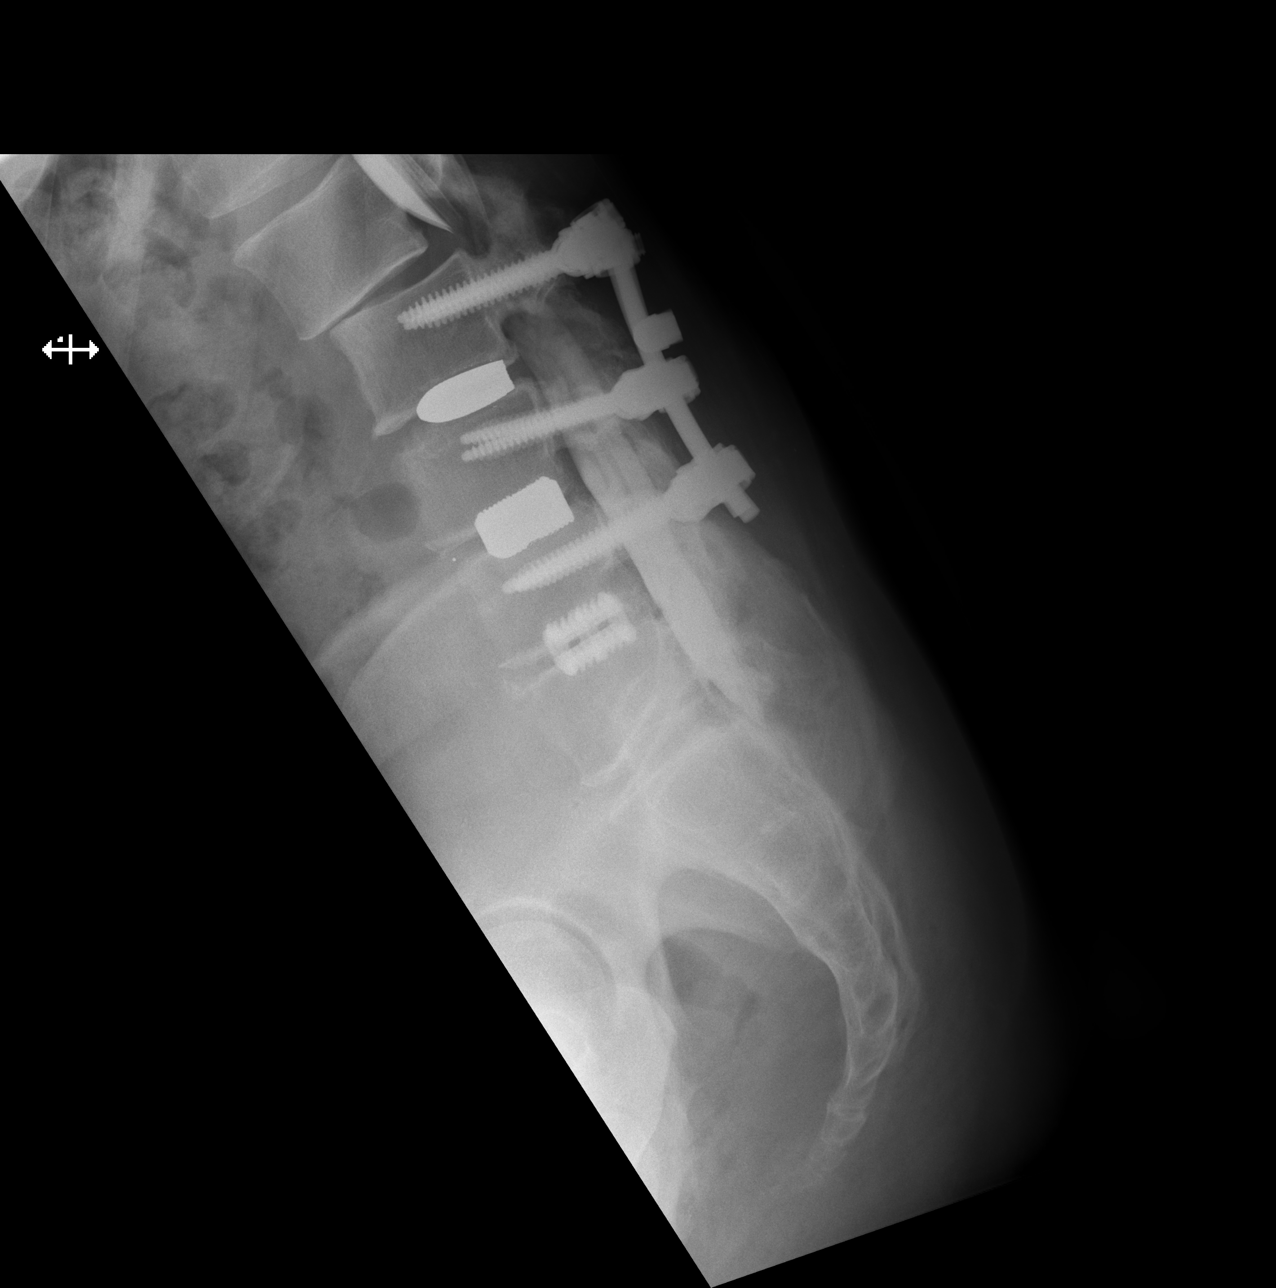

[w lumbar spine extension]
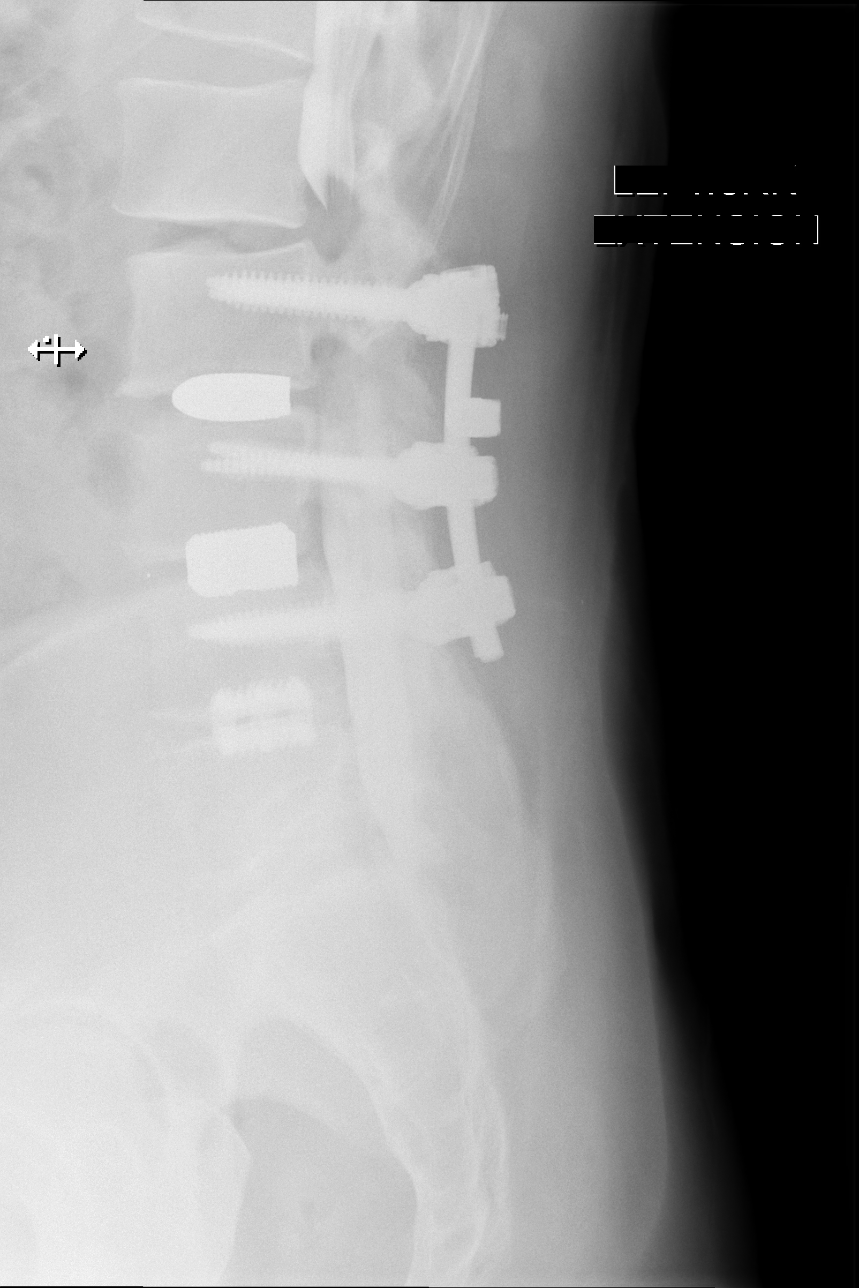

[3 of 3 positions shown; findings below may reference images not displayed]

EXAM:
LUMBAR MYELOGRAM

FLUOROSCOPY TIME:  Fluoroscopy Time (in minutes and seconds): 0
minutes 48 seconds

Number of Acquired Images:  15

PROCEDURE:
After thorough discussion of risks and benefits of the procedure
including bleeding, infection, injury to nerves, blood vessels,
adjacent structures as well as headache and CSF leak, written and
oral informed consent was obtained. Consent was obtained by Dr.
Monika Krzysztof Goik. Time out form was completed.

Patient was positioned prone on the fluoroscopy table. Local
anesthesia was provided with 1% lidocaine without epinephrine after
prepped and draped in the usual sterile fashion. Puncture was
performed at L5-S1 using a 3 1/2 inch 22-gauge spinal needle via a
midline approach. Using a single pass through the dura, the needle
was placed within the thecal sac, with return of clear CSF. 15 mL of
Emnipaque-J8G was injected into the thecal sac, with normal
opacification of the nerve roots and cauda equina consistent with
free flow within the subarachnoid space.

I personally performed the lumbar puncture and administered the
intrathecal contrast. I also personally supervised acquisition of
the myelogram images.
FINDINGS: LUMBAR MYELOGRAM FINDINGS:

Transitional lumbosacral anatomy is again seen. Numbering scheme
employed on the prior CT is maintained on this examination, with the
lowest fused level (interbody Ray cage) designated L5-S1 with S1
considered partially lumbarized.

Sequelae of L3-4 and L4-5 posterior and interbody fusion are
identified as well as L5-S1 interbody fusion. There is trace
anterolisthesis of L2 on L3 with prone positioning. This increases
to 7 mm with standing neutral position, further increases to 9 mm
with standing flexion, and largely reduces with extension. There is
a complete effacement of the thecal sac at this level, although
contrast does flow more cephalad. The spinal canal appears widely
patent more caudally.

CT LUMBAR MYELOGRAM FINDINGS:

Since the prior studies, the patient has undergone L3-4 posterior
and interbody fusion. Sequelae of prior L4-5 posterior fusion and
L4-5 and L5-S1 interbody fusion are again identified. Bilateral
pedicle screws at L3, L4, and L5 appear well positioned without
evidence of loosening. Old screw tracts are noted at S1. There is a
solid left-sided postererolateral fusion mass at L5-S1. Right-sided
postererolateral fusion mass appears solid from L4-S1 with possible
incomplete fusion at L3-4.

There is persistent straightening of the normal lumbar lordosis.
There is slight anterolisthesis of L4 on L5 and of L2 on L3.
Vertebral body heights are preserved. There is prominent vacuum disc
at L2-3. Conus medullaris terminates at L1. Minimal atherosclerotic
vascular calcification is noted.

L1-2:  Negative.

L2-3: Prominent circumferential disc bulging, small superimposed
central disc extrusion, ligamentum flavum hypertrophy, and advanced
facet arthrosis result in moderate spinal stenosis and
mild-to-moderate right and moderate left neural foraminal stenosis.
Disc and facet degeneration have greatly progressed since the prior
MRI.

L3-4: Interval posterior decompression and fusion. Evaluation is
partially limited by streak artifact from interbody spacer. No
evidence of residual spinal stenosis. Facet arthrosis results in
moderate right neural foraminal stenosis, likely slightly progressed
from the prior study. Mild left neural foraminal narrowing is
improved from prior.

L4-5: Prior posterior decompression and fusion. No evidence of
significant residual stenosis allowing for streak artifact.

L5-S1: Prior posterior decompression and fusion. No residual
stenosis.

S1-2:  Transitional level.  No stenosis.
IMPRESSION: 1. Transitional lumbosacral anatomy as above.
2. Interval L3-4 posterior decompression and fusion without residual
spinal stenosis. Moderate right neural foraminal stenosis may have
slightly progressed.
3. New adjacent segment disease at L2-3 with evidence of dynamic
instability. Moderate spinal stenosis with prone positioning which
becomes severe with standing due to increased anterolisthesis. Left
greater than right neural foraminal stenosis.
4. No residual stenosis at the postoperative L4-5 or L5-S1 levels.

## 2016-10-14 IMAGING — RF DG C-ARM 61-120 MIN
1 series · 2 of 2 positions shown · non-contrast
Comparison: 12/01/2014 postmyelogram CT.

CLINICAL DATA: 53-year-old male for L2-3 fusion. Subsequent
encounter.

EXAM:
DG C-ARM 61-120 MIN; LUMBAR SPINE - 2-3 VIEW
Fluoroscopic time provided 7 seconds.

[Series 1: run · 2 of 2 slices shown]
[im 1/2]
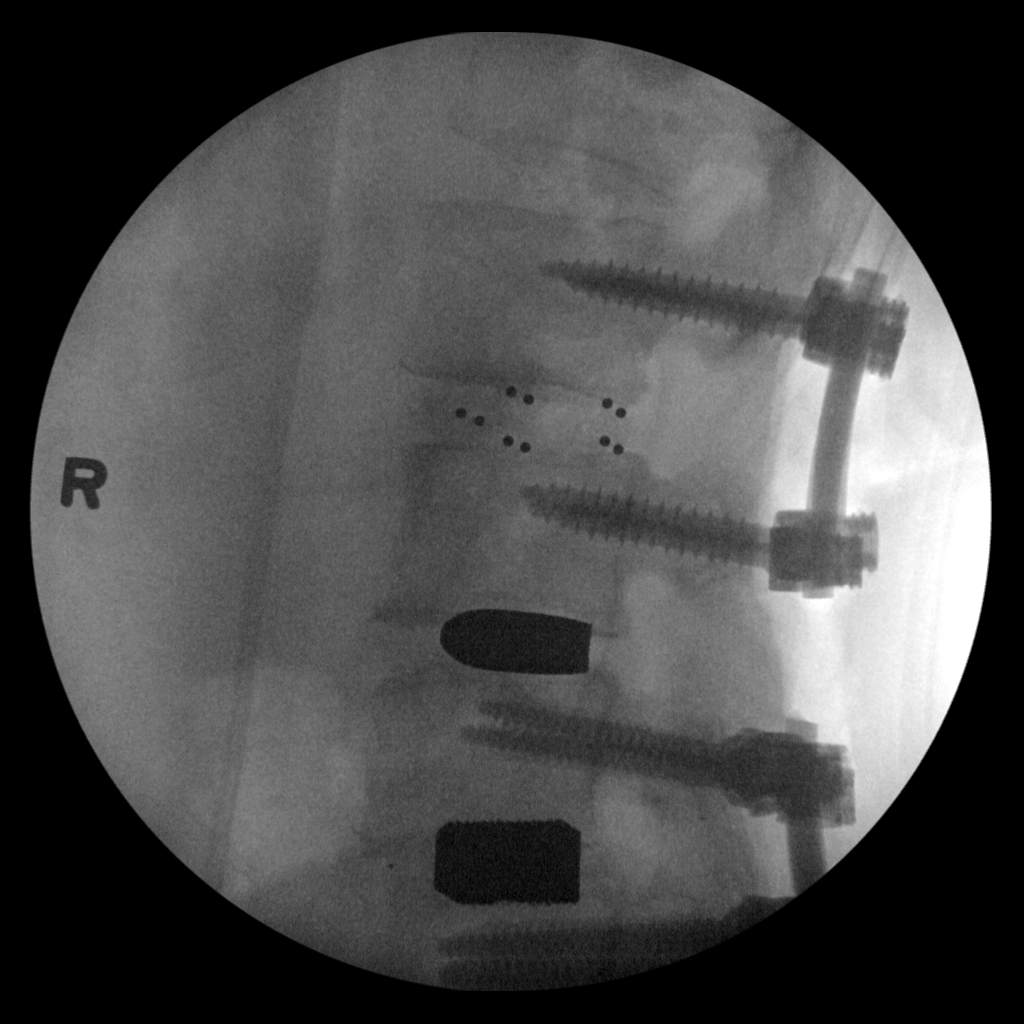
[im 2/2]
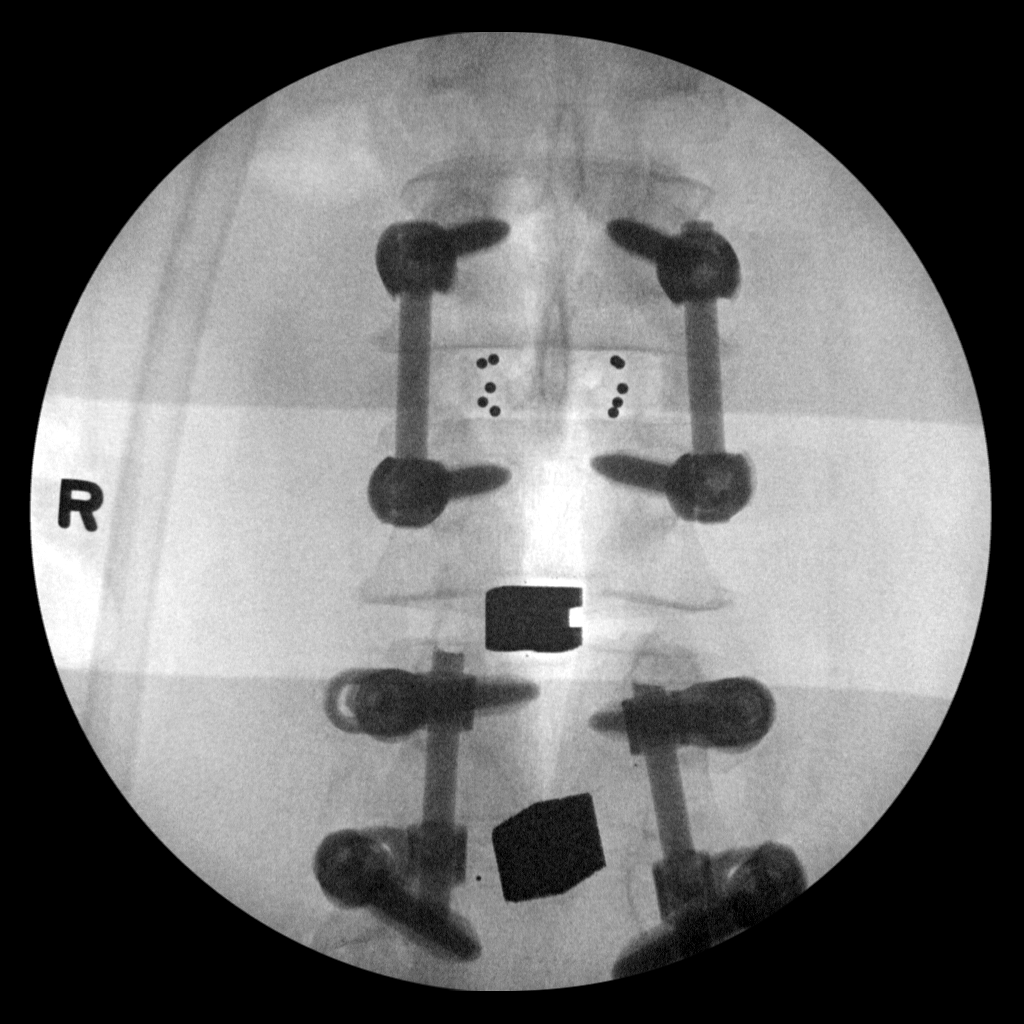

[2 of 2 positions shown; findings below may reference images not displayed]

FINDINGS: Two intraoperative C-arm views submitted for review after surgery.

Proper level assignment is not possible given the lack of complete
inclusion of the lumbar spine. On the preoperative postmyelogram CT,
rudimentary disc at the S1-2 level is noted with interbody spacers
L3-4, L4-5 and L5-S1 and bilateral pedicle screws with posterior
connecting bar spanning between L3 and L5. If one utilizes the
superior interbody spacer as the L3-4 level than the L3 pedicle
screws have been removed and posterior connecting bar partially
resected with placement of a new interbody spacer at the L2-3 level
with bilateral L2 and L3 pedicle screws and posterior connecting bar
place. This level assignment will need to be confirmed on follow-up.
IMPRESSION: It appears patient has had fusion extended to involve the L2-3 disc
space as detailed above. This will need to be confirmed with
follow-up plain film exam including the entire lumbar spine.
